# Patient Record
Sex: Female | Born: 1935 | Hispanic: No | Marital: Single
Health system: Southern US, Community
[De-identification: ages and names within clinical notes are randomized; demographics above are authoritative.]

---

## 2004-11-01 ENCOUNTER — Ambulatory Visit: Payer: Self-pay | Admitting: Internal Medicine

## 2004-12-10 ENCOUNTER — Emergency Department: Payer: Self-pay | Admitting: Unknown Physician Specialty

## 2004-12-13 ENCOUNTER — Inpatient Hospital Stay: Payer: Self-pay | Admitting: Unknown Physician Specialty

## 2005-12-28 ENCOUNTER — Ambulatory Visit: Payer: Self-pay | Admitting: Internal Medicine

## 2007-01-10 ENCOUNTER — Ambulatory Visit: Payer: Self-pay | Admitting: Internal Medicine

## 2007-05-07 ENCOUNTER — Ambulatory Visit: Payer: Self-pay | Admitting: Cardiovascular Disease

## 2007-05-24 ENCOUNTER — Inpatient Hospital Stay: Payer: Self-pay | Admitting: Internal Medicine

## 2008-01-14 ENCOUNTER — Ambulatory Visit: Payer: Self-pay | Admitting: Internal Medicine

## 2009-01-14 ENCOUNTER — Ambulatory Visit: Payer: Self-pay | Admitting: Internal Medicine

## 2009-07-19 ENCOUNTER — Ambulatory Visit: Payer: Self-pay | Admitting: Gynecologic Oncology

## 2009-07-27 ENCOUNTER — Ambulatory Visit: Payer: Self-pay | Admitting: Gynecologic Oncology

## 2009-08-18 ENCOUNTER — Ambulatory Visit: Payer: Self-pay | Admitting: Gynecologic Oncology

## 2009-08-26 ENCOUNTER — Ambulatory Visit: Payer: Self-pay | Admitting: Gastroenterology

## 2009-09-21 ENCOUNTER — Ambulatory Visit: Payer: Self-pay | Admitting: Gastroenterology

## 2009-10-08 ENCOUNTER — Ambulatory Visit: Payer: Self-pay | Admitting: Unknown Physician Specialty

## 2009-10-08 ENCOUNTER — Ambulatory Visit: Payer: Self-pay | Admitting: Cardiovascular Disease

## 2009-10-13 ENCOUNTER — Ambulatory Visit: Payer: Self-pay | Admitting: Gynecologic Oncology

## 2009-10-13 ENCOUNTER — Inpatient Hospital Stay: Payer: Self-pay | Admitting: Unknown Physician Specialty

## 2009-11-02 ENCOUNTER — Ambulatory Visit: Payer: Self-pay | Admitting: Gynecologic Oncology

## 2010-01-16 ENCOUNTER — Ambulatory Visit: Payer: Self-pay | Admitting: Gynecologic Oncology

## 2010-01-17 ENCOUNTER — Ambulatory Visit: Payer: Self-pay | Admitting: Internal Medicine

## 2010-02-01 ENCOUNTER — Ambulatory Visit: Payer: Self-pay | Admitting: Gynecologic Oncology

## 2010-02-16 ENCOUNTER — Ambulatory Visit: Payer: Self-pay | Admitting: Gynecologic Oncology

## 2010-05-19 ENCOUNTER — Ambulatory Visit: Payer: Self-pay | Admitting: Gynecologic Oncology

## 2010-06-07 ENCOUNTER — Ambulatory Visit: Payer: Self-pay | Admitting: Gynecologic Oncology

## 2010-06-08 LAB — CA 125: CA 125: 12.9 U/mL (ref 0.0–34.0)

## 2010-06-18 ENCOUNTER — Ambulatory Visit: Payer: Self-pay | Admitting: Gynecologic Oncology

## 2010-07-07 ENCOUNTER — Inpatient Hospital Stay: Payer: Self-pay | Admitting: Internal Medicine

## 2010-09-03 ENCOUNTER — Ambulatory Visit: Payer: Self-pay

## 2010-09-04 ENCOUNTER — Inpatient Hospital Stay: Payer: Self-pay | Admitting: Internal Medicine

## 2010-09-29 ENCOUNTER — Emergency Department: Payer: Self-pay | Admitting: Emergency Medicine

## 2011-11-28 ENCOUNTER — Ambulatory Visit: Payer: Self-pay | Admitting: Gynecologic Oncology

## 2011-12-18 ENCOUNTER — Ambulatory Visit: Payer: Self-pay | Admitting: Gynecologic Oncology

## 2012-04-26 LAB — URINALYSIS, COMPLETE
Bilirubin,UR: NEGATIVE
Blood: NEGATIVE
Nitrite: POSITIVE
Ph: 5 (ref 4.5–8.0)
RBC,UR: 1 /HPF (ref 0–5)
Specific Gravity: 1.012 (ref 1.003–1.030)
Squamous Epithelial: NONE SEEN
WBC UR: 112 /HPF (ref 0–5)

## 2012-04-27 ENCOUNTER — Inpatient Hospital Stay: Payer: Self-pay | Admitting: Internal Medicine

## 2012-04-27 LAB — CBC
HCT: 39.3 % (ref 35.0–47.0)
MCH: 32.2 pg (ref 26.0–34.0)
MCHC: 35 g/dL (ref 32.0–36.0)
Platelet: 118 10*3/uL — ABNORMAL LOW (ref 150–440)

## 2012-04-27 LAB — COMPREHENSIVE METABOLIC PANEL
Albumin: 3.5 g/dL (ref 3.4–5.0)
Alkaline Phosphatase: 79 U/L (ref 50–136)
Calcium, Total: 9.2 mg/dL (ref 8.5–10.1)
Co2: 27 mmol/L (ref 21–32)
Creatinine: 0.9 mg/dL (ref 0.60–1.30)
EGFR (African American): >60
EGFR (Non-African Amer.): 58 — ABNORMAL LOW
Glucose: 55 mg/dL — ABNORMAL LOW (ref 65–99)
Osmolality: 290 (ref 275–301)
SGPT (ALT): 16 U/L (ref 12–78)

## 2012-04-27 LAB — TROPONIN I
Troponin-I: <0.02 ng/mL
Troponin-I: <0.02 ng/mL
Troponin-I: <0.02 ng/mL

## 2012-04-27 LAB — TSH: Thyroid Stimulating Horm: 0.863 u[IU]/mL

## 2012-04-27 LAB — CK TOTAL AND CKMB (NOT AT ARMC)
CK, Total: 43 U/L (ref 21–215)
CK-MB: 1.2 ng/mL (ref 0.5–3.6)

## 2012-04-27 LAB — PROTIME-INR: INR: 0.9

## 2012-04-27 LAB — DIGOXIN LEVEL: Digoxin: 0.72 ng/mL

## 2012-04-28 DIAGNOSIS — R55 Syncope and collapse: Secondary | ICD-10-CM

## 2012-04-28 LAB — CBC WITH DIFFERENTIAL/PLATELET
Basophil %: 0.8 %
Eosinophil %: 2.2 %
HCT: 38.9 % (ref 35.0–47.0)
HGB: 13.5 g/dL (ref 12.0–16.0)
Lymphocyte #: 1.1 10*3/uL (ref 1.0–3.6)
Lymphocyte %: 26.1 %
MCH: 32.2 pg (ref 26.0–34.0)
MCHC: 34.8 g/dL (ref 32.0–36.0)
MCV: 93 fL (ref 80–100)
Monocyte #: 0.5 x10 3/mm (ref 0.2–0.9)
Monocyte %: 12.8 %
Platelet: 85 10*3/uL — ABNORMAL LOW (ref 150–440)
RBC: 4.2 10*6/uL (ref 3.80–5.20)
WBC: 4.1 10*3/uL (ref 3.6–11.0)

## 2012-04-28 LAB — BASIC METABOLIC PANEL
Anion Gap: 8 (ref 7–16)
BUN: 19 mg/dL — ABNORMAL HIGH (ref 7–18)
Calcium, Total: 9.1 mg/dL (ref 8.5–10.1)
Co2: 28 mmol/L (ref 21–32)
EGFR (African American): 60 — ABNORMAL LOW
EGFR (Non-African Amer.): 52 — ABNORMAL LOW
Glucose: 197 mg/dL — ABNORMAL HIGH (ref 65–99)
Osmolality: 293 (ref 275–301)
Sodium: 143 mmol/L (ref 136–145)

## 2012-04-28 LAB — MAGNESIUM: Magnesium: 2.1 mg/dL

## 2012-04-28 LAB — URINE CULTURE

## 2012-05-07 IMAGING — CR DG CHEST 1V PORT
1 series · 1 of 1 positions shown · non-contrast
Comparison: none

REASON FOR EXAM: Fever
COMMENTS:

[view not recorded]
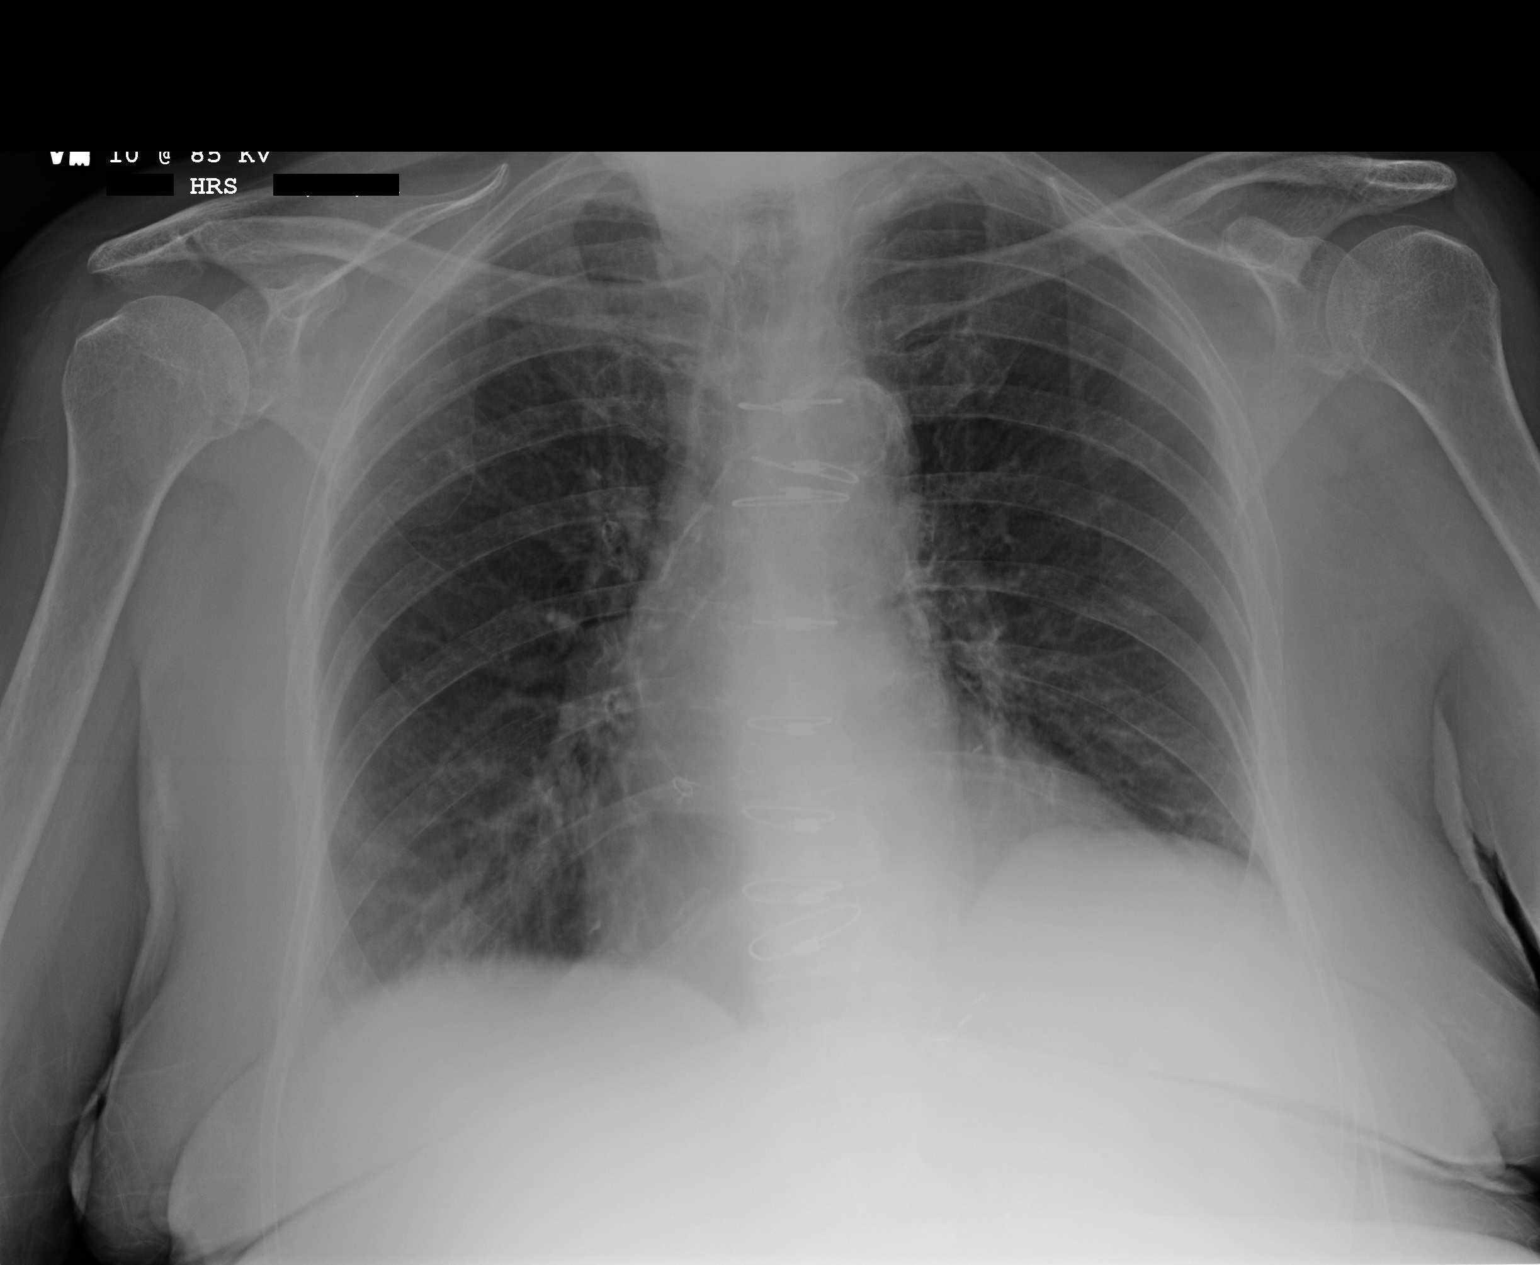

[1 of 1 positions shown; findings below may reference images not displayed]

PROCEDURE:     DXR - DXR PORTABLE CHEST SINGLE VIEW  - September 08, 2010 [DATE]

RESULT:     Comparison is made to the study of 09/03/2010.

There is minimal increased density at the right lung base which may be
atelectasis versus infiltrate. Bronchiectasis can give this appearance.
There is some motion artifact. Follow-up PA and lateral views would be
helpful when the patient's condition permits. Atherosclerotic calcification
is prominently demonstrated in the aorta. No large effusion is evident.
There appear to be some prominent lung markings likely fibrotic in origin.
IMPRESSION: 1.     Right lung base pneumonia versus bronchiectasis. Follow-up PA and
lateral views would be helpful when the patient's condition permits. Other
findings as listed above.

## 2012-05-13 IMAGING — CR DG HIP COMPLETE 2+V*L*
1 series · 2 of 2 positions shown · non-contrast
Comparison: none

REASON FOR EXAM: fall
COMMENTS:

PROCEDURE:     DXR - DXR HIP LEFT COMPLETE  - September 14, 2010  [DATE]
RESULT:     Two views of the left hip were obtained. No fracture,
dislocation or other acute bony abnormality is identified

[Series 1: view not recorded · 0.17mm/px · 2 of 2 slices shown]
[im 1/2]
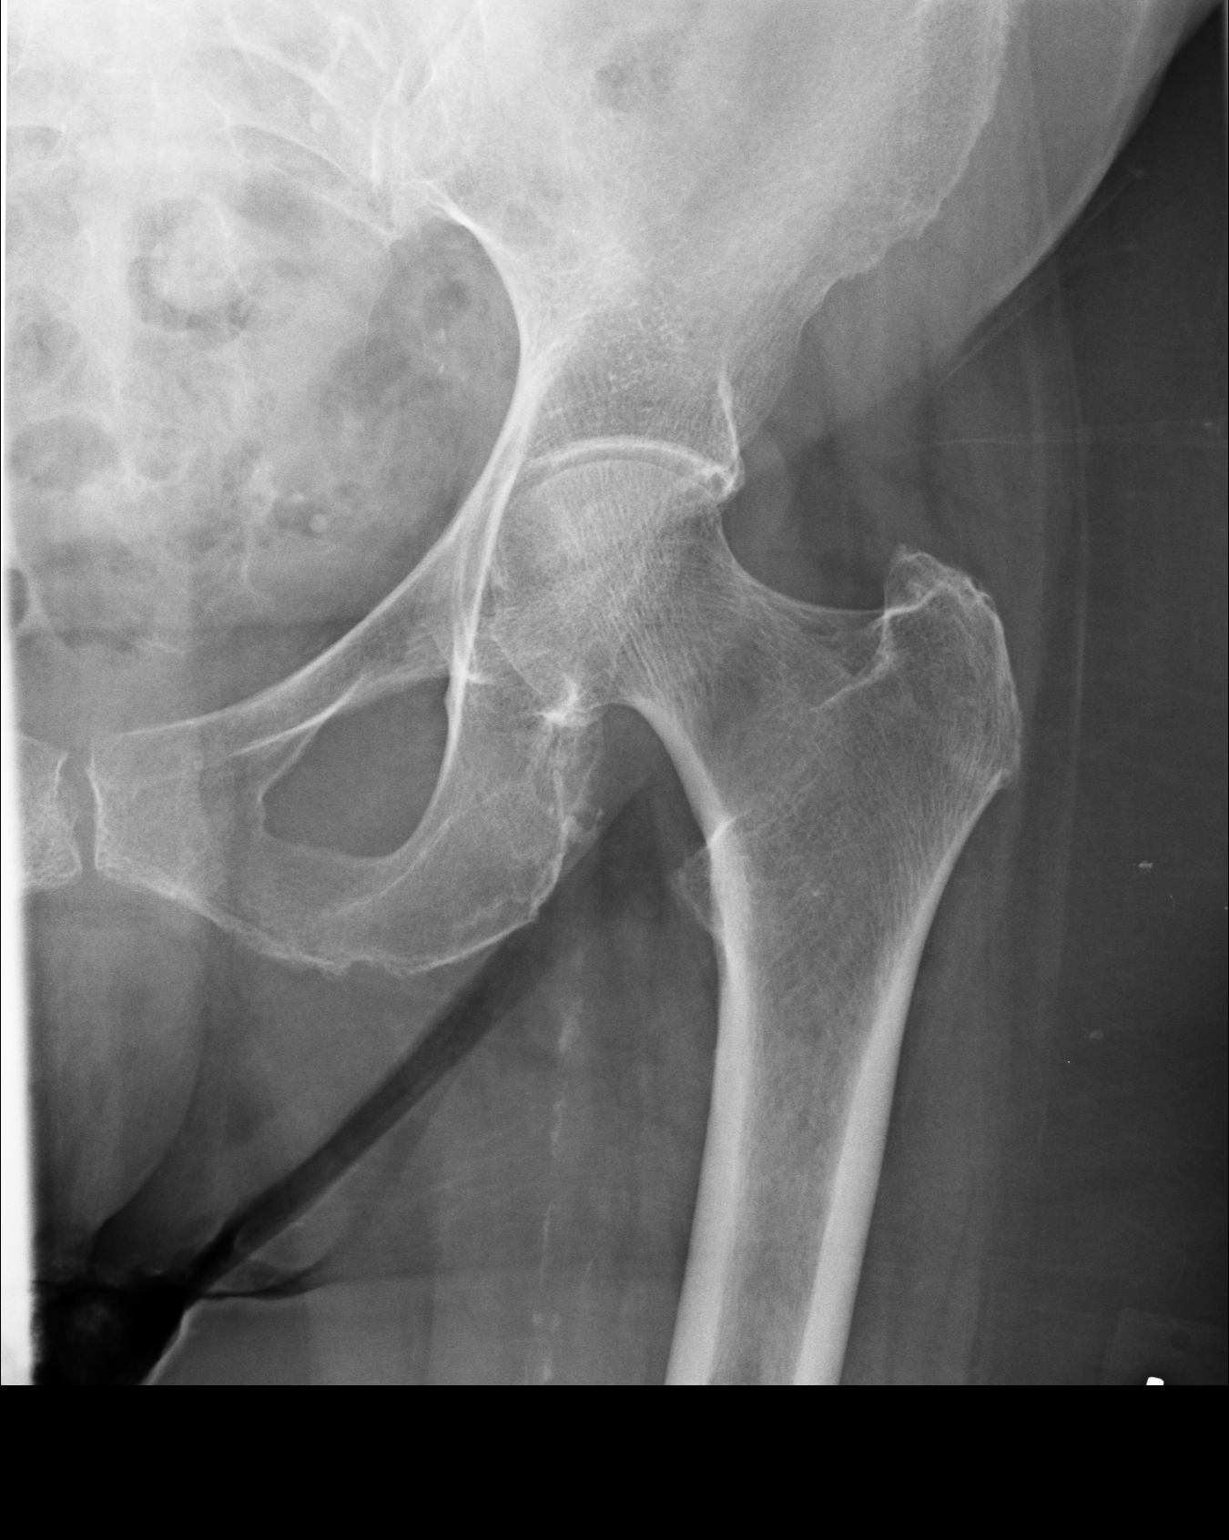
[im 2/2]
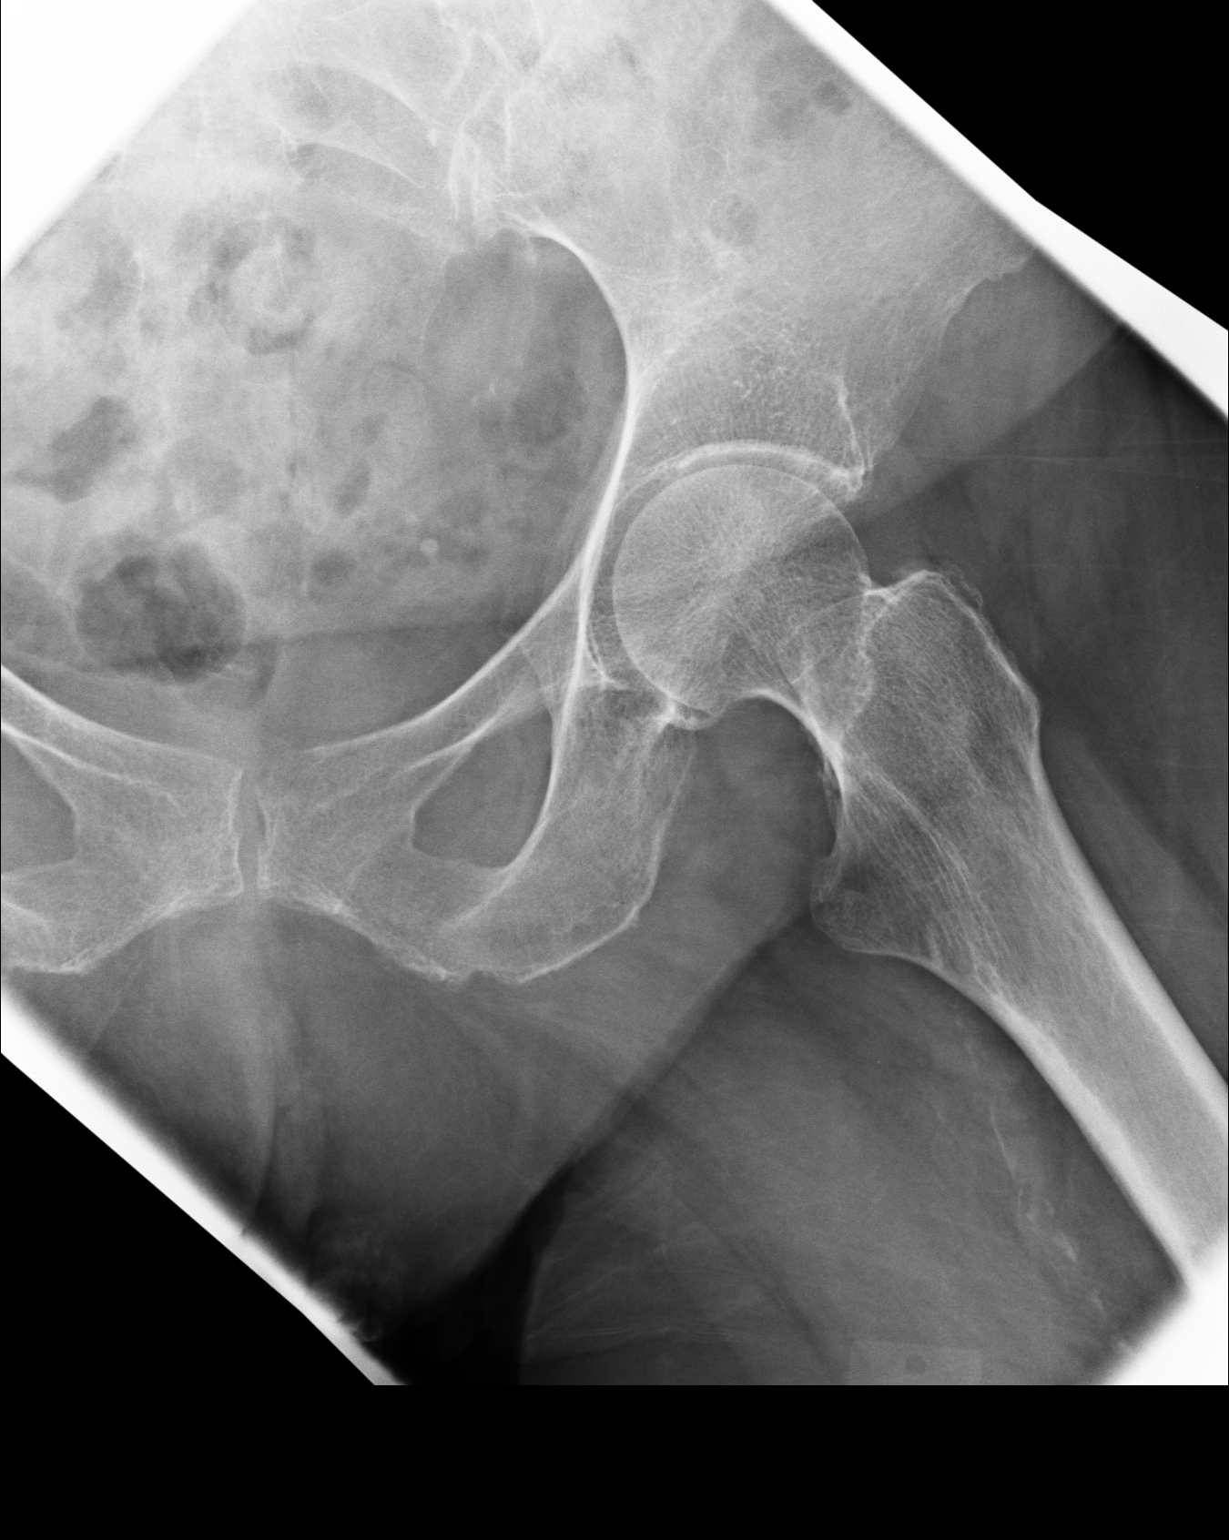

[2 of 2 positions shown; findings below may reference images not displayed]

IMPRESSION: No significant osseous abnormalities are noted.

## 2012-09-16 ENCOUNTER — Inpatient Hospital Stay: Payer: Self-pay | Admitting: Internal Medicine

## 2012-09-16 DIAGNOSIS — I499 Cardiac arrhythmia, unspecified: Secondary | ICD-10-CM

## 2012-09-16 LAB — COMPREHENSIVE METABOLIC PANEL
Alkaline Phosphatase: 112 U/L (ref 50–136)
BUN: 32 mg/dL — ABNORMAL HIGH (ref 7–18)
Bilirubin,Total: 0.3 mg/dL (ref 0.2–1.0)
Calcium, Total: 9.2 mg/dL (ref 8.5–10.1)
Chloride: 95 mmol/L — ABNORMAL LOW (ref 98–107)
Creatinine: 1.19 mg/dL (ref 0.60–1.30)
EGFR (African American): 48 — ABNORMAL LOW
EGFR (Non-African Amer.): 41 — ABNORMAL LOW
SGOT(AST): 18 U/L (ref 15–37)
SGPT (ALT): 13 U/L (ref 12–78)
Total Protein: 7.3 g/dL (ref 6.4–8.2)

## 2012-09-16 LAB — CBC
MCH: 30.6 pg (ref 26.0–34.0)
Platelet: 200 10*3/uL (ref 150–440)
RBC: 3.69 10*6/uL — ABNORMAL LOW (ref 3.80–5.20)
RDW: 13.3 % (ref 11.5–14.5)
WBC: 8.4 10*3/uL (ref 3.6–11.0)

## 2012-09-16 LAB — URINALYSIS, COMPLETE
Bilirubin,UR: NEGATIVE
Blood: NEGATIVE
Nitrite: NEGATIVE
Ph: 5 (ref 4.5–8.0)
Protein: 30
RBC,UR: NONE SEEN /HPF (ref 0–5)
WBC UR: 708 /HPF (ref 0–5)

## 2012-09-16 LAB — PROTIME-INR: Prothrombin Time: 13.5 secs (ref 11.5–14.7)

## 2012-09-16 LAB — RAPID INFLUENZA A&B ANTIGENS

## 2012-09-16 LAB — DIGOXIN LEVEL: Digoxin: 1.62 ng/mL

## 2012-09-16 LAB — PRO B NATRIURETIC PEPTIDE: B-Type Natriuretic Peptide: 1867 pg/mL — ABNORMAL HIGH (ref 0–450)

## 2012-09-17 LAB — BASIC METABOLIC PANEL
Anion Gap: 13 (ref 7–16)
Calcium, Total: 9.6 mg/dL (ref 8.5–10.1)
Co2: 25 mmol/L (ref 21–32)
Creatinine: 0.95 mg/dL (ref 0.60–1.30)
EGFR (African American): >60
Osmolality: 283 (ref 275–301)
Potassium: 3.8 mmol/L (ref 3.5–5.1)

## 2012-09-17 LAB — CBC WITH DIFFERENTIAL/PLATELET
Basophil #: 0 10*3/uL (ref 0.0–0.1)
Basophil %: 0.1 %
Eosinophil #: 0 10*3/uL (ref 0.0–0.7)
Eosinophil %: 0.2 %
HCT: 33.7 % — ABNORMAL LOW (ref 35.0–47.0)
HGB: 11.7 g/dL — ABNORMAL LOW (ref 12.0–16.0)
Lymphocyte %: 5.2 %
MCHC: 34.6 g/dL (ref 32.0–36.0)
MCV: 91 fL (ref 80–100)
Monocyte #: 1.3 x10 3/mm — ABNORMAL HIGH (ref 0.2–0.9)
Neutrophil #: 8 10*3/uL — ABNORMAL HIGH (ref 1.4–6.5)
Neutrophil %: 81.4 %
RBC: 3.71 10*6/uL — ABNORMAL LOW (ref 3.80–5.20)

## 2012-09-18 ENCOUNTER — Ambulatory Visit: Payer: Self-pay | Admitting: Internal Medicine

## 2012-09-20 LAB — BASIC METABOLIC PANEL
Anion Gap: 7 (ref 7–16)
BUN: 28 mg/dL — ABNORMAL HIGH (ref 7–18)
Calcium, Total: 10.1 mg/dL (ref 8.5–10.1)
Chloride: 101 mmol/L (ref 98–107)
Co2: 32 mmol/L (ref 21–32)
Creatinine: 0.9 mg/dL (ref 0.60–1.30)
EGFR (African American): >60
EGFR (Non-African Amer.): 57 — ABNORMAL LOW
Glucose: 118 mg/dL — ABNORMAL HIGH (ref 65–99)
Osmolality: 286 (ref 275–301)
Potassium: 4.3 mmol/L (ref 3.5–5.1)
Sodium: 140 mmol/L (ref 136–145)

## 2012-09-22 LAB — CULTURE, BLOOD (SINGLE)

## 2012-10-19 ENCOUNTER — Ambulatory Visit: Payer: Self-pay | Admitting: Internal Medicine

## 2012-10-19 DEATH — deceased

## 2014-05-19 IMAGING — CR DG CHEST 1V PORT
1 series · 1 of 1 positions shown · non-contrast
Comparison: none

REASON FOR EXAM: hypoxia
COMMENTS:

[ap]
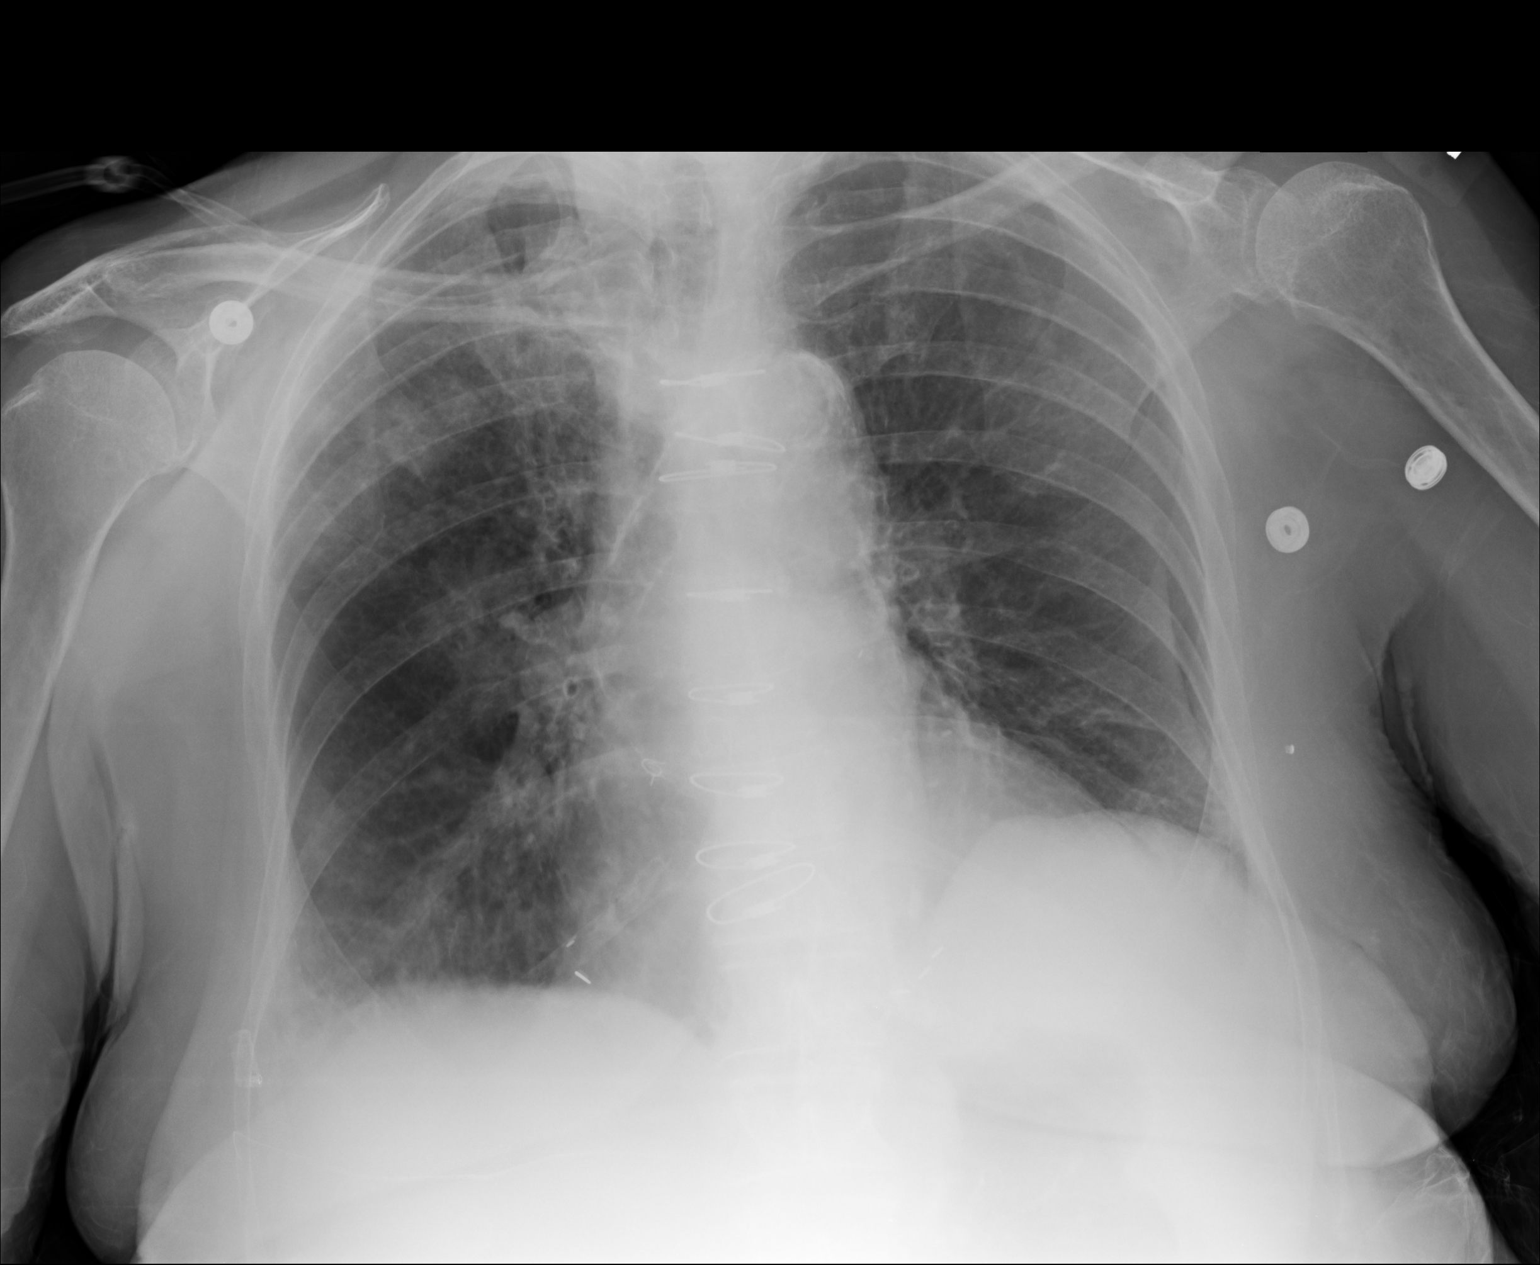

[1 of 1 positions shown; findings below may reference images not displayed]

PROCEDURE:     DXR - DXR PORTABLE CHEST SINGLE VIEW  - September 19, 2012  [DATE]

RESULT:     Comparison is made to the previous examination dated 16 September, 2012. The patient is rotated slightly toward the right. Cardiac size is
normal. Prominent atherosclerotic calcification is seen within the aortic
arch. Patchy increasing density is present in the right upper lobe with a
nodular appearance adjacent to the anterior second rib. Followup chest CT is
recommended. Lung base atelectasis or minimal infiltrate is present with
some blunting of the right costophrenic angle. Surgical clips project
medially at the right lung base. Peribronchial thickening is present which
could represent bronchitis or edema.
IMPRESSION: Atherosclerotic disease. Nodular density in the right upper
lobe. Chest CT follow up is recommended. The previous carotid CTA on 27 April, 2012 demonstrates a 5.3 mm posterior nodule in the superior segment
of the right lower lobe. Some patchy basilar atelectasis and a trace right
pleural effusion.

[REDACTED]

## 2015-01-05 NOTE — Discharge Summary (Signed)
PATIENT NAME:  Rita Johnson, Rita Johnson MR#:  [id number] DATE OF BIRTH:  Feb 06, 1923  DATE OF ADMISSION:  04/27/2012 DATE OF DISCHARGE:  04/29/2012  DISCHARGE DIAGNOSES:  1. Presyncope/syncope likely due to positive orthostasis with echocardiogram showing moderately reduced LV function, EF of 25 to 30%, moderate tricuspid regurgitation, mild to moderate mitral regurgitation. Physical therapy recommended home with home health.  2. Left carotid stenosis likely not contributing to her symptoms as per Vascular Surgery and no further surgical plans per them. Outpatient follow-up.  3. Hypomagnesemia, repleted and resolved.  4. Urinary tract infection, treated.   SECONDARY DIAGNOSES:  1. Chronic obstructive pulmonary disease.  2. History of flash pulmonary edema.  3. Acute renal failure.  4. Coronary artery disease, status post coronary artery bypass graft. 5. Parkinson's disease.  6. Ischemic cardiomyopathy with EF of 20%.  7. Diabetes.  8. Depression.  9. Adenocarcinoma of endometrium, status post surgery.   CONSULTANTS:  1. Vascular surgery, Dr. Gilda Crease   2. Physical therapy   PROCEDURES/RADIOLOGY:  1. Chest x-ray on August 9th showed COPD with fibrosis.  2. CT scan of the head without contrast on August 10th showed chronic small vessel ischemic disease. No acute intracranial abnormality.  3. CT angio of the carotids on August 10th showed 70% stenosis of left ICA. There is atherosclerotic irregularity present in the same area. Small ulcers are not excluded. Nonvisualization of the right posterior communicating segment.  4. Bilateral carotid Doppler on August 10th showed severe stenosis in the left carotid bulb/proximal left internal carotid artery appearing 75% to 95% stenosed. Atherosclerotic disease in the right carotid without severe stenosis.  5. 2-D echocardiogram on August 10th showed moderately reduced LV function. EF of 25 to 30%. Moderate tricuspid regurgitation. Mild to moderate mitral  regurgitation.  6. Urinalysis on admission showed 112 WBCs, 3+ bacteria, WBC in clumps present, 3+ leukocyte esterase and positive nitrite.  7. Urine culture was contaminated.   HISTORY AND SHORT HOSPITAL COURSE: The patient is an 81 year old female with the above-mentioned medical problems who was admitted for possible presyncope/syncope. Please see Dr. Margaretmary Eddy dictated history and physical for further details. She was also found to have possible urinary tract infection on admission and was started on antibiotic. The patient underwent carotid Doppler's which showed significant left-sided carotid stenosis for which Vascular Surgery consultation was obtained who recommended carotid angiogram which was performed and showed 70% stenosis. Considering her age and other comorbidities, Vascular surgery did not want to do any intervention at this time and maximize antiplatelet therapy and outpatient follow-up. The patient was evaluated by physical therapy who recommended home with home health. She was found to be orthostatic while in the hospital and her blood pressure medication was cut. She was feeling much better on August 12th and was discharged home in stable condition.   On the date of discharge her vital signs were as follows: Temperature 97.2, heart rate 68 per minute, respirations 20 per minute, blood pressure 128/66 mmHg. She was saturating 98% on room air.   PERTINENT PHYSICAL EXAMINATION ON THE DATE OF DISCHARGE: CARDIOVASCULAR: S1, S2 normal. No murmurs, rubs, or gallop. LUNGS: Clear to auscultation bilaterally. No wheezing, rales, rhonchi, or crepitation. ABDOMEN: Soft, benign. NEUROLOGIC: Nonfocal examination. All other physical examination remained at baseline.   DISCHARGE MEDICATIONS:  1. Vitamin C 500 mg 1 tablet p.o. daily.  2. Fluoxetine 20 mg p.o. b.i.d.  3. Nitrostat 0.4 mg sublingual every five minutes as needed.  4. Aspirin 81 mg p.o. daily.  5.  Simvastatin 40 mg 1 tablet p.o. at  bedtime.  6. Digoxin 0.125 mg p.o. daily.  7. Fish Oil 1000 mg 2 capsules p.o. daily.  8. Calcium with Vitamin D 1 tablet p.o. three times a day.  9. Citalopram 10 mg p.o. daily.  10. Donepezil 10 mg p.o. at bedtime. 11. Alprazolam 0.5 mg p.o. 3 times a day as needed.  12. Metformin 500 mg 2 tablets p.o. daily.  13. Trazodone 50 mg p.o. b.i.d.  14. Glimepiride 4 mg p.o. b.i.d.  15. Namenda 10 mg p.o. b.i.d.  16. Risperidone 0.25 mg p.o. daily at bedtime (1 to 2 tablets).. 17. Enalapril 5 mg p.o. daily.   DISCHARGE DIET: Low sodium, 1800 ADA.   DISCHARGE ACTIVITY: As tolerated.   DISCHARGE INSTRUCTIONS AND FOLLOW-UP:  1. The patient was instructed to follow-up with her primary care physician, Dr. Dewaine Oatsenny Tate, in 1 to 2 weeks.  2. She will need follow-up with Dr. Levora DredgeGregory Schnier in 4 to 6 weeks.  3. She was set up to get home health with physical therapy.   TOTAL TIME DISCHARGING THIS PATIENT: 55 minutes.   ____________________________ Ellamae SiaVipul S. Sherryll BurgerShah, MD vss:drc D: 04/29/2012 22:27:15 ET T: 04/30/2012 12:24:49 ET JOB#: [id number]  cc: Doniesha Landau S. Sherryll BurgerShah, MD, <Dictator> Jillene Bucksenny C. Arlana Pouchate, MD Renford DillsGregory G. Schnier, MD Patricia PesaVIPUL S Jettson Crable MD ELECTRONICALLY SIGNED 05/02/2012 15:56

## 2015-01-05 NOTE — Consult Note (Signed)
General Aspect Carotid stenosis    Present Illness The patient is an 81 year old female with known history of chronic obstructive pulmonary disease, coronary artery disease, and ischemic cardiomyopathy who was admitted for presyncope/syncope. The patient is unable to provide much history due to her dementia. As per family, they got her up to clean her and she walked with a walker but then she slumped over the bathroom wall. EMS was called. At first when she was trying to get up and speak, her words were not intelligible to family.   PAST MEDICAL HISTORY:  1. Chronic obstructive pulmonary disease.  2. History of flash pulmonary edema.  3. Acute renal failure.  4. Coronary artery disease, status post coronary artery bypass graft.  5. Parkinson's disease. 6. Coronary artery bypass graft.  7. Ischemic cardiomyopathy with ejection fraction of 20%.  8. Type 2 diabetes.  9. Depression.  10. Adenocarcinoma of the endometrium status post surgery.   Home Medications: Medication Instructions Status  enalapril tablet 10 mg 1 tab(s) orally once a day  Active  Vitamin C 500 mg oral tablet 1  orally once a day  Active  FLUoxetine 20 mg capsule 1 cap(s) orally 2 times a day x 30 days  Active  Nitrostat 0.4 mg tablet 1 tab(s) sublingual every 5 minutes as needed   Active  Aspir-Low 81 mg enteric coated tablet 1 tab(s) orally once a day  Active  simvastatin 40 mg tablet 1 tab(s) orally once a day (at bedtime)  Active  digoxin 125 mcg (0.125 mg) oral tablet 1 tab orally once a day  Active  Fish Oil 1000 mg oral capsule 2 cap orally once a day Active  Calcium 600+D 600 mg-200 intl units oral tablet 1 tab(s) orally 3 times a day Active  citalopram 10 mg oral tablet 1 tab(s) orally once a day Active  donepezil 10 mg oral tablet 1 tab(s) orally once a day (at bedtime) Active  alprazolam 0.5 mg oral tablet 1 tab(s) orally 3 times a day, As Needed- for Anxiety, Nervousness  Active  metformin 500 mg oral  tablet, extended release 2 tab(s) orally once a day Active  nitrofurantoin macrocrystals 25 mg oral capsule 2 cap(s) orally once a day Active  trazodone 50 mg oral tablet 1 tab(s) orally 2 times a day Active  glimepiride 4 mg oral tablet tab(s) orally 2 times a day Active  Namenda 10 mg oral tablet 1 tab(s) orally 2 times a day Active  risperidone 0.25 mg oral tablet tab(s) orally once a day (at bedtime) 1-2 tablets Active    No Known Allergies:   Case History:   Family History Non-Contributory    Social History negative tobacco, negative ETOH, negative Illicit drugs   Review of Systems:   ROS Pt not able to provide ROS   Physical Exam:   GEN well developed, well nourished    HEENT PERRL, hearing intact to voice, poor dentition    NECK supple  trachea midline  No incisional scar on the right noted    RESP normal resp effort  no use of accessory muscles    CARD regular rate  positive carotid bruits  no JVD    ABD denies tenderness  soft    EXTR negative cyanosis/clubbing, negative edema    SKIN No rashes, No ulcers, skin turgor poor    NEURO cranial nerves intact, follows commands, motor/sensory function intact    PSYCH alert, A+O to time, place, person   Nursing/Ancillary Notes: **  Vital Signs.:   10-Aug-13 12:09   Vital Signs Type Q 4hr   Temperature Temperature (F) 96.2   Celsius 35.6   Temperature Source tympanic   Pulse Pulse 61   Respirations Respirations 16   Systolic BP Systolic BP 532   Diastolic BP (mmHg) Diastolic BP (mmHg) 66   Mean BP 79   Pulse Ox % Pulse Ox % 95   Pulse Ox Activity Level  At rest   Oxygen Delivery Room Air/ 21 %   Thyroid:  10-Aug-13 00:42    Thyroid Stimulating Hormone 0.863 (0.45-4.50 (International Unit)  ----------------------- Pregnant patients have  different reference  ranges for TSH:  - - - - - - - - - -  Pregnant, first trimetser:  0.36 - 2.50 uIU/mL)  Hepatic:  10-Aug-13 00:42    Bilirubin, Total 0.6    Alkaline Phosphatase 79   SGPT (ALT) 16   SGOT (AST) 24   Total Protein, Serum 6.5   Albumin, Serum 3.5  TDMs:  10-Aug-13 00:42    Digoxin, Serum 0.72 (Therapeutic range for digoxin in patients with atrial fibrillation: 0.8 - 2.0 ng/mL. In patients with congestive heart failure a therapeutic range of 0.5 - 0.8 ng/mL is suggested as higher levels are associated with an increased risk of toxicity without clear evidence of enhanced efficacy. Digoxin toxicity is commonly associated with serum levels > 2.0 ng/mL but may occur with lower levels, including those in the therapeutic range. Blood samples should be obtained 6-8 hours after administration to assure a reasonable volume of distribution.)  Cardiology:  10-Aug-13 09:26    Echo Doppler  Interpretation Summary   Moderately reduced LVF, estimated EF 25-30%. Mod TR. Mild-mod MR.   PatientHeight: 163 cm   PatientWeight: 68 kg   BSA: 1.7 m2  Procedure:   The study was completed  bedside.   A two-dimensional transthoracic echocardiogram with color flow and  Doppler was performed.  Left Ventricle  Wall Motion Analysis   Basal Posterolateral wall: Hypokinetic    Basal Inferior Septum: Hypokinetic    Mid Posterolateral wall: Hypokinetic    Mid Inferior wall: Hypokinetic    Mid-Inferior Septum: Hypokinetic    Apical Septum: Hypokinetic    The left ventricle is moderately dilated.   There is mild concentric left ventricular hypertrophy.   Left ventricular systolic function is moderately reduced.  Mitral Valve   There is moderate mitral regurgitation.  Tricuspid Valve   There is mild to moderate tricuspid regurgitation.  Aortic Valve   No aortic regurgitation is present.   No hemodynamically significant valvular aortic stenosis.  Pericardium/Pleural   No pericardial effusion.  MMode 2D Measurements and Calculations   IVSd: 1.3 cm   LVIDd: 5.1 cm   LVIDs: 4.5 cm   LVPWd: 1.1 cm   FS: 12 %   EF(Teich): 26 %   Ao  root diam: 3.3 cm   ACS: 1.7 cm   LA dimension: 3.5 cm   LVOT diam: 2.0cm  Doppler Measurements and Calculations   MV E point: 101 cm/sec   MV A point: 108 cm/sec   MV E/A: 0.94    MV V2 max: 130 cm/sec   MV max PG: 7.0 mmHg   MV V2 mean: 80 cm/sec   MV mean PG: 3.0 mmHg   MV V2 VTI: 42 cm   MV P1/2t max vel:102 cm/sec   MV P1/2t: 77 msec   MVA(P1/2t): 2.8 cm2   MV dec slope: 387 cm/sec2   MV dec  time: 0.22 sec   Ao V2 max: 127 cm/sec   Ao max PG: 6.0 mmHg   Ao V2 mean: 75 cm/sec   Ao mean PG: 2.7 mmHg   Ao V2 VTI: 29 cm   AVA(I,D): 1.9 cm2   AVA(V,D): 1.8 cm2   LV max PG: 2.0 mmHg   LV mean PG: 0.87 mmHg   LV V1 max: 74 cm/sec   LV V1 mean: 42 cm/sec   LV V1 VTI: 17 cm   MR max vel: 517 cm/sec   MR max PG: 107 mmHg   SV(LVOT): 55 ml   PA V2 max: 96 cm/sec   PA max PG: 4.0 mmHg  PA acc time: 0.11 sec   TR Max vel: 227 cm/sec   TR Max PG: 21 mmHg   RVSP: 26 mmHg   RAP systole: 5.0 mmHg   PA pr(Accel): 31 mmHg  Reading Physician: Isaias Cowman  SonographerJaci Standard Interpreting Physician:  Isaias Cowman,  electronically signed  on 04-27-2012 14:40:13 Requesting Physician: Isaias Cowman  Routine Chem:  10-Aug-13 00:42    Glucose, Serum  55   BUN  22   Creatinine (comp) 0.90   Sodium, Serum 145   Potassium, Serum 4.1   Chloride, Serum  108   CO2, Serum 27   Calcium (Total), Serum 9.2   Osmolality (calc) 290   eGFR (African American) >60   eGFR (Non-African American)  58 (eGFR values <6m/min/1.73 m2 may be an indication of chronic kidney disease (CKD). Calculated eGFR is useful in patients with stable renal function. The eGFR calculation will not be reliable in acutely ill patients when serum creatinine is changing rapidly. It is not useful in  patients on dialysis. The eGFR calculation may not be applicable to patients at the low and high extremes of body sizes, pregnant women, and vegetarians.)   Anion Gap 10   Magnesium,  Serum  1.3 (1.8-2.4 THERAPEUTIC RANGE: 4-7 mg/dL TOXIC: > 10 mg/dL  -----------------------)  Cardiac:  10-Aug-13 00:42    Troponin I < 0.02 (0.00-0.05 0.05 ng/mL or less: NEGATIVE  Repeat testing in 3-6 hrs  if clinically indicated. >0.05 ng/mL: POTENTIAL  MYOCARDIAL INJURY. Repeat  testing in 3-6 hrs if  clinically indicated. NOTE: An increase or decrease  of 30% or more on serial  testing suggests a  clinically important change)   CK, Total 43   CPK-MB, Serum 1.2 (Result(s) reported on 27 Apr 2012 at 01:16AM.)    08:16    Troponin I < 0.02 (0.00-0.05 0.05 ng/mL or less: NEGATIVE  Repeat testing in 3-6 hrs  if clinically indicated. >0.05 ng/mL: POTENTIAL  MYOCARDIAL INJURY. Repeat  testing in 3-6 hrs if  clinically indicated. NOTE: An increase or decrease  of 30% or more on serial  testing suggests a  clinically important change)    18:01    Troponin I < 0.02 (0.00-0.05 0.05 ng/mL or less: NEGATIVE  Repeat testing in 3-6 hrs  if clinically indicated. >0.05 ng/mL: POTENTIAL  MYOCARDIAL INJURY. Repeat  testing in 3-6 hrs if  clinically indicated. NOTE: An increase or decrease  of 30% or more on serial  testing suggests a  clinically important change)  Routine Coag:  10-Aug-13 00:42    Prothrombin 12.8   INR 0.9 (INR reference interval applies to patients on anticoagulant therapy. A single INR therapeutic range for coumarins is not optimal for all indications; however, the suggested range for most indications is 2.0 - 3.0. Exceptions to the INR  Reference Range may include: Prosthetic heart valves, acute myocardial infarction, prevention of myocardial infarction, and combinations of aspirin and anticoagulant. The need for a higher or lower target INR must be assessed individually. Reference: The Pharmacology and Management of the Vitamin K  antagonists: the seventh ACCP Conference on Antithrombotic and Thrombolytic Therapy. ZYSAY.3016 Sept:126 (3suppl):  [id number]. A HCT value >55% may artifactually increase the PT.  In one study,  the increase was an average of 25%. Reference:  "Effect on Routine and Special Coagulation Testing Values of Citrate Anticoagulant Adjustment in Patients with High HCT Values." American Journal of Clinical Pathology 2006;126:400-405.)  Routine Hem:  10-Aug-13 00:42    WBC (CBC) 4.6   RBC (CBC) 4.27   Hemoglobin (CBC) 13.7   Hematocrit (CBC) 39.3   Platelet Count (CBC)  118 (Result(s) reported on 27 Apr 2012 at 01:10AM.)   MCV 92   MCH 32.2   MCHC 35.0   RDW 13.3     Impression 1.Left carotid stenosis      patient has critical stenosis of the left ICA with a widely patent right Carotid      continue antiplatelet therapy for now      I will need to discuss possible treatment options with the patient's family present. 2.  Possible presyncope/syncope.            I am still concerned that this is cardiac in etiology she is on telemetry  echo pending.  3.  Hypomagnesemia.             We will replete and recheck.  4.  Diabetes.               We will continue home medications and start her on sliding scale insulin.  5.  History of coronary artery disease              continue home meds              nitrates as needed    Plan Level 4 consult   Electronic Signatures: Hortencia Pilar (MD)  (Signed 10-Aug-13 19:40)  Authored: General Aspect/Present Illness, Home Medications, Allergies, History and Physical Exam, Vital Signs, Labs, Impression/Plan   Last Updated: 10-Aug-13 19:40 by Hortencia Pilar (MD)

## 2015-01-05 NOTE — H&P (Signed)
PATIENT NAME:  Rita Johnson, Rita Johnson MR#:  [id number] DATE OF BIRTH:  Jun 11, 1923  DATE OF ADMISSION:  04/27/2012  PRIMARY CARE PHYSICIAN: Dewaine Oats, MD  REQUESTING PHYSICIAN: Si Raider, MD  CHIEF COMPLAINT: Passing out.  HISTORY OF PRESENT ILLNESS: The patient is an 81 year old female with known history of chronic obstructive pulmonary disease, coronary artery disease, and ischemic cardiomyopathy who is being admitted for presyncope/syncope. The patient is unable to provide much history due to her dementia and most of the information was obtained from the chart, nursing information and family. As per family, they got her up to clean her and she walked with a walker but then she slumped over the bathroom wall. EMS was called and family requested her to come to the emergency department. When EMS came, she was still on the toilet. At first when she was trying to get up and speak, her words were not intelligible to family. She cannot recall why she is here.   PAST MEDICAL HISTORY:  1. Chronic obstructive pulmonary disease.  2. History of flash pulmonary edema.  3. Acute renal failure.  4. Coronary artery disease, status post coronary artery bypass graft.  5. Parkinson's disease. 6. Coronary artery bypass graft.  7. Ischemic cardiomyopathy with ejection fraction of 20%.  8. Type 2 diabetes.  9. Depression.  10. Carotid artery stenosis status post right-sided endarterectomy.  11. Adenocarcinoma of the endometrium status post surgery.   ALLERGIES: No known drug allergies.   REVIEW OF SYSTEMS: Unobtainable as the patient is demented.   FAMILY HISTORY: Positive for hypertension.   SOCIAL HISTORY: She is a former smoker. No alcohol or illicit drug use. She lives in a nursing healthcare center.   MEDICATIONS AT HOME:  1. Alprazolam 0.5 mg p.o. three times daily as needed.  2. Aspirin 81 mg p.o. daily. 3. Calcium with vitamin D 1 tablet p.o. three times daily. 4. Citalopram 10 mg p.o. daily.   5. Digoxin 125 mcg p.o. daily.  6. Donepezil 10 mg p.o. at bedtime.  7. Enalapril 10 mg p.o. daily.  8. Fish oil 1000 mg 2 capsules p.o. daily.  9. Fluoxetine 20 mg p.o. twice a day. 10. Glimepiride 4 mg p.o. twice a day.  11. Metformin 500 mg 2 tablets p.o. daily. 12. Namenda 10 mg p.o. twice a day. 13. Nitrofurantoin 2 capsules p.o. daily.  14. Nitrostat 0.4 mg sublingual every five minutes as needed.  15. Risperidone 0.25 mg 1 tablet to 2 tablets p.o. at bedtime. 16. Simvastatin 40 mg p.o. at bedtime.  17. Trazodone 50 mg p.o. twice a day. 18. Vitamin C 500 mg p.o. daily.   PHYSICAL EXAMINATION:   VITAL SIGNS: Temperature 98, heart rate 77 per minute, respirations 16 per minute, blood pressure 119/58 mmHg, and she is saturating 91% on room air.   GENERAL: The patient is an 81 year old female lying in the bed comfortably without any acute distress.   EYES: Pupils are equal, round, and reactive to light and accommodation. No scleral icterus. Extraocular movements are intact.   HENT: Head atraumatic, normocephalic. Oropharynx and nasopharynx clear.   NECK: Supple. No jugular venous distention. No thyroid enlargement or tenderness.   LUNGS: Clear to auscultation bilaterally. No wheezing, rales, rhonchi, or crepitation.  ABDOMEN: Soft. No organomegaly appreciated. No tenderness. No distention.   SKIN: No obvious rash, lesion, or ulcer.   EXTREMITIES: No edema, cyanosis, or clubbing.   NEUROLOGICAL: Difficult to assess due to her severe dementia. She answers some questions appropriately if  you go close to her ears as she seems to have some trouble hearing. Sensation intact.   LABORATORY, DIAGNOSTIC AND RADIOLOGIC DATA: BMP within normal limits except magnesium 1.3 and blood glucose of 55. Normal first set of cardiac enzymes. Normal albumin/protein ratio. Normal serum digoxin of 0.72. Normal CBC except platelet count 118. Coagulation panel within normal limits.   Using urinalysis  showed 3+ leukocyte esterase, positive nitrite, 112 WBCs, 3+ bacteria, and WBC in clumps present.   EKG shows normal sinus rhythm with first degree AV block.  CT scan of the head showed no evidence of acute intracranial hemorrhage or skull fracture. Large white matter infarct in the right occipital lobe, possible subacute to chronic. Superimposed acute component cannot be excluded.   IMPRESSION AND PLAN:  1. Possible presyncope/syncope. We will obtain serial troponins, check orthostatic vitals, TSH, and get carotid Doppler's along with 2-D echocardiogram. We will get physical therapy evaluation and management and monitor her on telemetry.  2. Hypomagnesemia. We will replete and recheck.  3. Diabetes. We will continue home medications and start her on sliding scale insulin.  4. History of coronary artery disease, status post coronary artery bypass graft. Recommend continuing aspirin and monitor her on telemetry.      CODE STATUS: FULL CODE.   TOTAL TIME SPENT: 55 minutes.  ____________________________ Ellamae SiaVipul S. Sherryll BurgerShah, MD vss:slb D: 04/27/2012 05:34:53 ET T: 04/27/2012 10:07:48 ET JOB#: [id number]  cc: Phuc Kluttz S. Sherryll BurgerShah, MD, <Dictator> Jillene Bucksenny C. Arlana Pouchate, MD Ellamae SiaVIPUL S [facility] MD ELECTRONICALLY SIGNED 04/28/2012 11:16

## 2015-01-08 NOTE — Discharge Summary (Signed)
PATIENT NAME:  Rita Johnson, Rita Johnson MR#:  [id number] DATE OF BIRTH:  20-Mar-1923  DATE OF ADMISSION:  09/16/2012 DATE OF DISCHARGE:    DISPOSITION:  To [facility].  CODE STATUS: DO NOT RESUSCITATE.   CONSULTATIONS:   1.  Palliative care consult with Dr. Harvie JuniorPhifer.  2.  Physical therapy consult.  DISCHARGE DIAGNOSES: 1.  Altered mental status, due to urinary tract infection and also due to her underlying dementia.  2.  Chronic obstructive pulmonary disease, stable.  3.  Coronary artery disease.  4.  Peripheral vascular disease.  5.  History of ischemic cardiomyopathy with ejection fraction of 25%. 6.  History of carotid artery disease with right carotid endarterectomy.  7.  History of recurrent urinary tract infections.  8.  Hyperlipidemia.  9.  Depression.  10.  Other diagnoses include acute on chronic systolic heart failure, chronic atrial fibrillation diabetes and deconditioning.   HOSPITAL COURSE: The patient is an 81 year old female with history of COPD, coronary artery disease, Parkinson disease, dementia, diabetes, ischemic cardiomyopathy who came in because of confusion and altered mental status. The patient was found to have E. coli UTIs multidrug resistant and she received Rocephin.  So far she received 8 days of Rocephin, her blood cultures have been negative and urine shows E. coli sensitive to cefazolin, nitrofurantoin and Rocephin.  He finished 8 days of Rocephin.    LABORATORY AND DIAGNOSTIC DATA:  The patient's influenza titers were negative.  White count was 8.4 on admission. EKG showed sinus rhythm with PACs.  Chest x-ray showed mild CHF and superimposed pneumonitis.   1.  The patient, mental status-wise, is back to her baseline. She was able to communicate to eat her diet sometimes and sometimes she would refuse. The patient was seen by palliative care team, Dr. Harvie JuniorPhifer, and had a discussion with the family members.  The son and daughter-in-law in the course had us change  her DNR.  They are aware of the progress of dementia and possible (Dictation Anomaly) <MISSING TEXT> due to multiple comorbid issues. The patient's family wanted the patient to go to short-term rehab to improve her strength. The patient has been seen by physical therapist, they recommended short-term rehab for her.  The [facility] had [facility]. 2.  Dysphagia.  Due to her dementia, seen by speech therapy who recommended pureed diet with thin liquids and strict aspiration precautions.  3.  Diabetes mellitus, type II.  Due to dementia, sometimes her p.o. intake is poor so we stopped her oral diabetic medications. She did have some hypoglycemia episodes due to Amaryl, which is on hold now. She is to get only sliding coverage.   4.  Hypertension and history of chronic atrial fibrillation and history of ischemic cardiomyopathy with acute on chronic systolic heart failure. The patient developed respiratory failure on January 1st, early morning.  At that time, Dr. (Dictation Anomaly)<MISSING TEXT> saw the patient. The patient's IV fluids were stopped, 1 dose of Lasix was given, and she was on BiPAP for some time which was taken off.  Right now she is on room air, saturating at around 90% and respiratory failure thought to be secondary to fluid overload with acute on chronic systolic heart failure which did resolve with Lasix.  Right now she is not in failure and her volume status is normal. She does not need ongoing doses of Lasix but can be given in case she develops shortness of breath or shows signs of edema.  5.  History of chronic atrial fibrillation, rate is controlled. Continue digoxin and also small dose of metoprolol is added to her regimen.  She is on metoprolol 25 mg daily and heart rate is stable.  6.  Anxiety.  Use Xanax as needed 0.5 mg every 8 hours p.r.n.   DISCHARGE MEDICATIONS: 1.  Aspirin 81 mg daily.  2.  Aricept 10 mg p.o. daily.  3.  She can get Haldol 0.5 mg at bedtime  for psychosis and delirium.  4.  Namenda 10 mg p.o. Johnson.i.d.  5.  Trazodone 150 mg at bedtime.  6.  Simvastatin 40 mg daily.  7.  Zantac 150 mg every 12 hours. 8.  Digoxin 0.125 mg p.o. daily.  9.  Lasix:  Discontinue the Lasix at this time.  10.  Metoprolol 25 mg p.o. daily.  11.  The patient will be on Omnicef 500 mg p.o. Johnson.i.d. for 3 more days.  Time spent on discharge preparation:  More than 30 minutes.    ____________________________ Katha Hamming, MD sk:ct D: 09/23/2012 13:20:30 ET T: 09/23/2012 14:05:26 ET JOB#: [id number]  cc: Katha Hamming, MD, <Dictator> Katha Hamming MD ELECTRONICALLY SIGNED 10/13/2012 22:57

## 2015-01-08 NOTE — H&P (Signed)
PATIENT NAME:  Rita Johnson, Rita Johnson DATE OF BIRTH:  1922/10/26   DATE OF ADMISSION:  09/16/2012  PRIMARY CARE PHYSICIAN:  Katherina Right C. Arlana Pouch, MD  REFERRING PHYSICIAN:  Darien Ramus, MD  CHIEF COMPLAINT:  Confusion.   HISTORY OF PRESENT ILLNESS:  The patient is an 81 year old Caucasian female with a past medical history of COPD, coronary artery disease, Parkinson disease, ischemic cardiomyopathy, diabetes mellitus, depression, carotid artery stenosis and adenocarcinoma of the endometrium who was brought into the ER from retirement home for more confusion. The patient resides in a retirement home and she was sent over to the ER for worsening of confusion. The patient has underlying dementia and is a poor historian. In the ER, the patient was diagnosed with urinary tract infection, IV Rocephin and fluids were given and hospitalist team is called to admit the patient. No family members were available at bedside. The patient has underlying dementia and being more confused, I was not able to get any history from her. History is obtained from ER staff and old medical records. To all my questions, whatever I asked, the patient was answering "no."  PAST MEDICAL HISTORY:  COPD, coronary artery disease status post CABG, cardiomyopathy, Alzheimer disease, diabetes mellitus, Parkinson disease, depression, adenocarcinoma of the endometrium.   PAST SURGICAL HISTORY:  Status post coronary artery bypass grafting, status post endometrial surgery,  right-sided carotid endarterectomy.   ALLERGIES:  No known drug allergies.   HOME MEDICATIONS:  Zocor 40 mg once a day, vitamin C 2 tablets once a day, trazodone 50 mg 3 tablets once a day, sulfamethoxazole 1 tablet p.o. once a day, Nitrostat 0.4 mg as needed for chest pain, Namenda 10 mg 2 times a day, Mucinex 600 mg once a day, haloperidol 0.5 mg  tablet once a day, glimepiride 4 mg 1 tablet 2 times a day, fish oil 1000 mg 2 times a day,  donepezil 10 mg 1 tablet p.o. once a  day, digoxin 125 mcg 1 tablet p.o. once a day and aspirin 81 mg once a day.  PSYCHOSOCIAL HISTORY:  She is residing in retirement home, former smoker according to old medical records. No history of alcohol or illicit drug usage.   FAMILY HISTORY:  Positive for hypertension.   REVIEW OF SYSTEMS:  Unobtainable as the patient is demented.   PHYSICAL EXAMINATION:   VITAL SIGNS: Temperature 98.6, pulse 99, respiratory rate 18 to 20, blood pressure 114/55, pulse oximetry 95% to 100%.  GENERAL APPEARANCE: Not in any acute distress, moderately built and moderately nourished.  HEENT: Normocephalic, atraumatic. Pupils are equally reacting to light and accommodation. No conjunctival injection. No nasal congestion noticed. Moist mucous membranes.  NECK: Supple. No JVD. No thyromegaly.  LUNGS: Clear to auscultation bilaterally. No accessory muscle usage. No anterior chest wall tenderness.  CARDIAC: S1, S2 normal. Regular rate and rhythm. Peripheral pulses are 2+.  GASTROINTESTINAL: Soft. Bowel sounds positive in all 4 quadrants. Nontender, nondistended.  NEUROLOGIC: Awake and alert, oriented to person.  EXTREMITIES: No edema. No cyanosis.  SKIN: No lesions. No rashes.  PSYCHIATRIC: Mood is agitated.  MUSCULOSKELETAL: No joint effusion or erythema.   DIAGNOSTIC DATA:  Glucose 210, BNP 1867, BUN 32, creatinine 1.19, sodium 132, potassium 4.4, chloride 95, CO2 is 27, GFR 41, anion gap 10, serum osmolality 78, calcium 9.2. Troponin is less than 0.02. Digoxin is 1.62. WBC 8.4, hemoglobin 11.3, hematocrit 38.7, platelet count 200,000. PT is 13.5 and INR 1.0. Influenza swab is negative for A and Johnson. Urinalysis:  Protein 30 mg/dL, nitrite negative, leukocyte esterase 3+, WBCs 708, bacteria 3+.   ASSESSMENT AND PLAN:  An 81 year old Caucasian female brought into the Emergency Room from retirement home for  confusion. Will be admitted with the following assessment and plan:  1.  Altered mental status, probably from  acute cystitis.  2.  Acute cystitis. Will admit the patient. Will get urine culture and sensitivity. Will give her IV Rocephin. Gentle hydration with IV fluids will be provided.  3.  With the patient being confused, we will get neuro checks.  4.  Chronic obstructive pulmonary disease. Will provide her DuoNeb. She is not in exacerbation.  5.  Coronary artery disease. Resume home medications.  6.  Parkinson disease. Continue Namenda.  7.  Diabetes mellitus. The patient will be on insulin sliding scale.  8.  We will provide her gastrointestinal and deep vein thrombosis prophylaxis.   No family members are available to discuss about the diagnosis and plan of care.   TOTAL TIME SPENT ON THE ADMISSION: 50 minutes.    ____________________________ Ramonita LabAruna Akela Pocius, MD ag:si D: 09/16/2012 20:18:00 ET T: 09/16/2012 20:52:48 ET JOB#: [id number]  cc: Ramonita LabAruna Ziana Heyliger, MD, <Dictator> cc: Jillene Bucksenny C. Arlana Pouchate, MD Ramonita LabARUNA Lyzette Reinhardt MD ELECTRONICALLY SIGNED 09/22/2012 2:05

## 2015-01-08 NOTE — Consult Note (Signed)
    Comments   Had a lengthy meeting with pt's son, daughter-in-law, and caregiver. Family updated regarding present medical status. Family say that patient had been relatively stable since moving to ALF ~ 6 months ago. She does have advanced dementia. She is still ambulatory but requires assistance with all daily care including feeding. FAST score 6E. We discussed expected progression of dementia and possible decline secondary to multiple comorbid issues. They seem to understand that patient may not do well in the long-term and may not return to her previous baseline. We talked about involvement of hospice care at ALF. At this point family want patient to go to STR to improve her strength before she returns to ALF. Discussed code status extensively. Son says that pt has a living will and would not want resuscitation. Will change code status to DNR per family wishes and complete a portable form.  45 minutes  Electronic Signatures for Addendum Section:  Phifer, Harriett SineNancy (MD) (Signed Addendum 03-Jan-14 20:40)  Discussed with Elouise MunroeJosh Borders, NP, in detail. Agree with assessment and plan as outlined in above note.   Electronic Signatures: Borders, Daryl EasternJoshua R (NP)  (Signed 03-Jan-14 16:27)  Authored: Palliative Care   Last Updated: 03-Jan-14 20:40 by Phifer, Harriett SineNancy (MD)
